# Patient Record
Sex: Female | Born: 1995 | Hispanic: Yes | Marital: Single | Smoking: Never smoker
Health system: Southern US, Community
[De-identification: ages and names within clinical notes are randomized; demographics above are authoritative.]

## PROBLEM LIST (undated history)

## (undated) DIAGNOSIS — F4325 Adjustment disorder with mixed disturbance of emotions and conduct: Secondary | ICD-10-CM

## (undated) DIAGNOSIS — R45851 Suicidal ideations: Secondary | ICD-10-CM

## (undated) HISTORY — PX: NO PAST SURGERIES: SHX2092

---
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
## 2018-06-03 IMAGING — CR DG CHEST 2V
2 series · 2 of 2 positions shown · non-contrast
Comparison: None.

CLINICAL DATA: Pt admitted after suicide attempt; pt had
tachycardia and cough.

EXAM:
CHEST  2 VIEW

[chest pa]
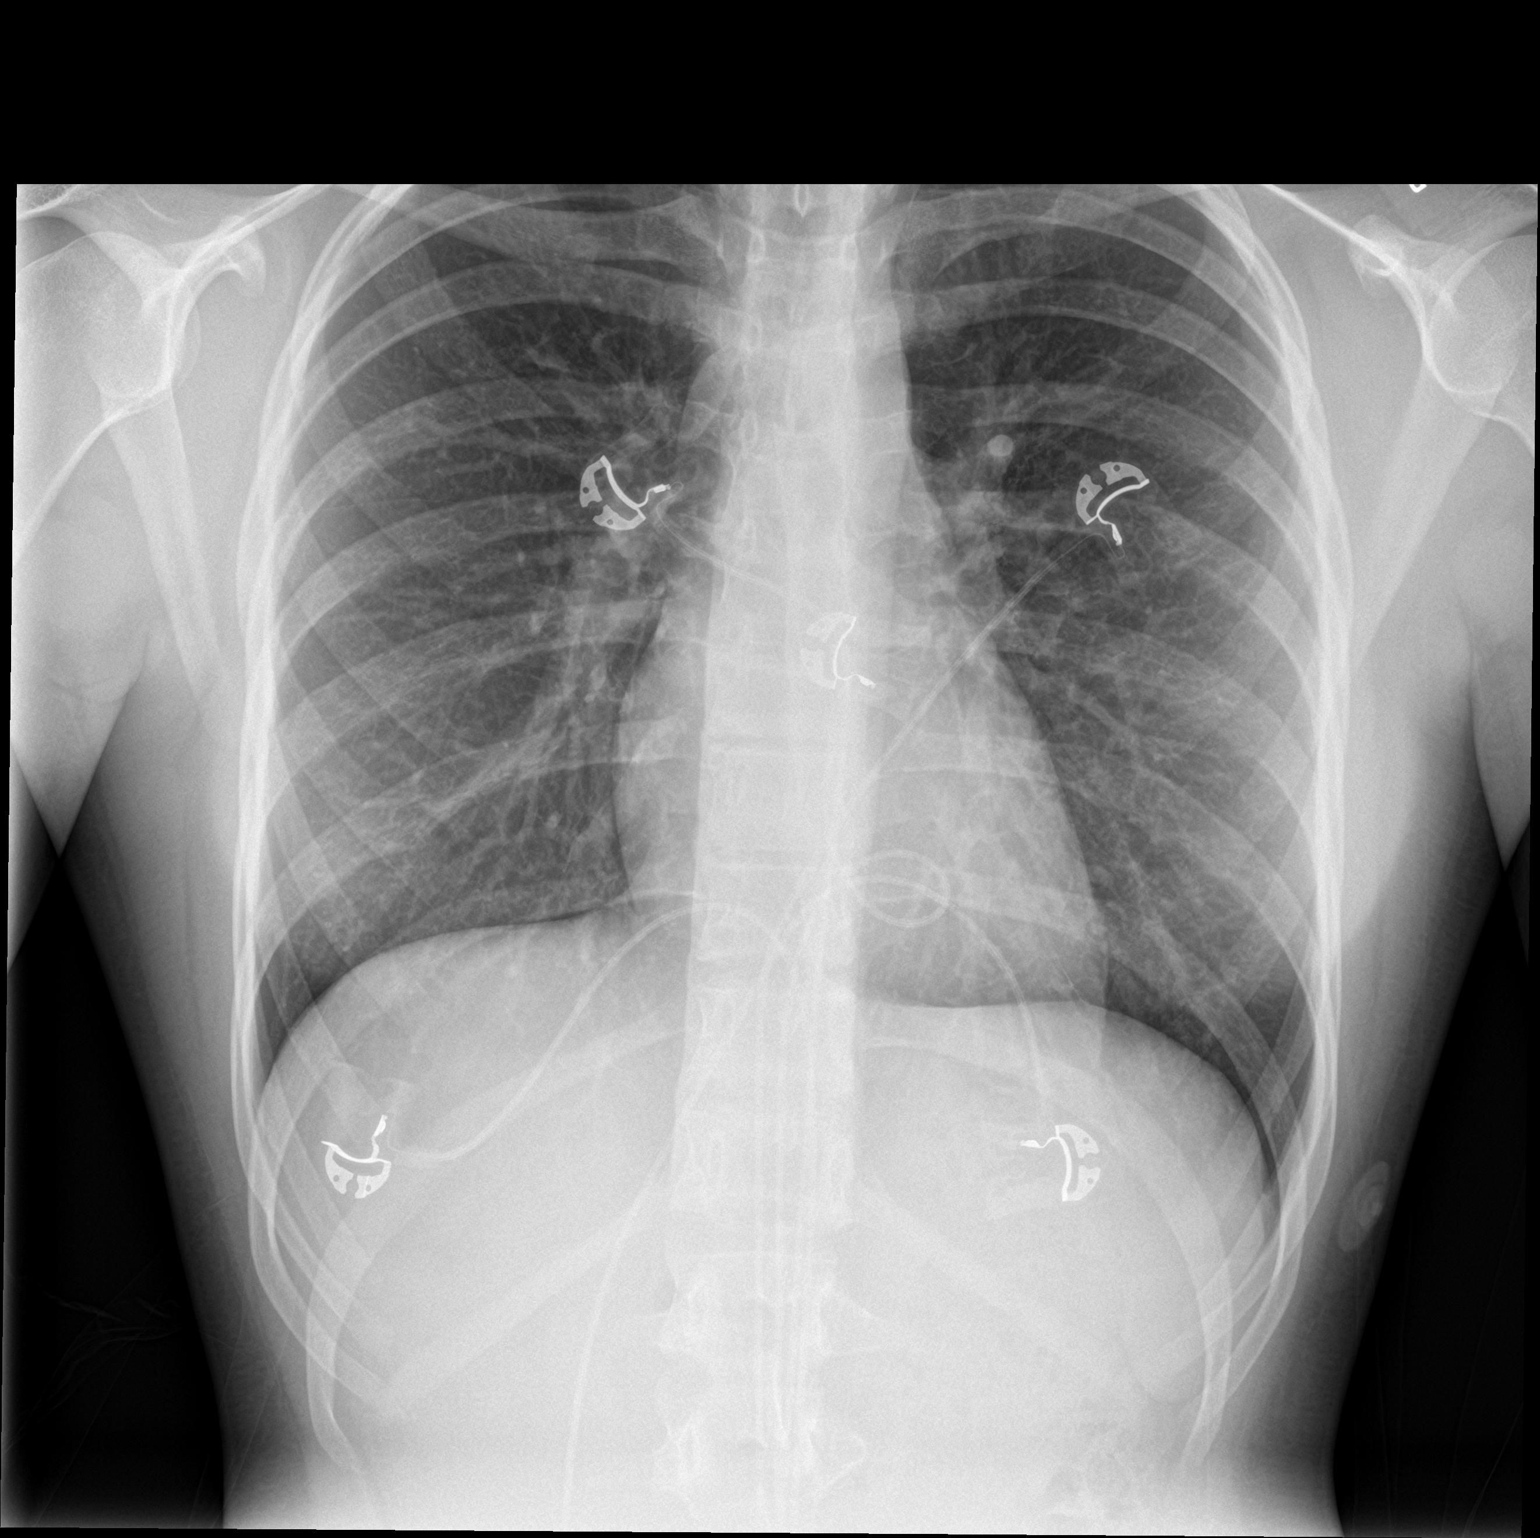

[chest lat]
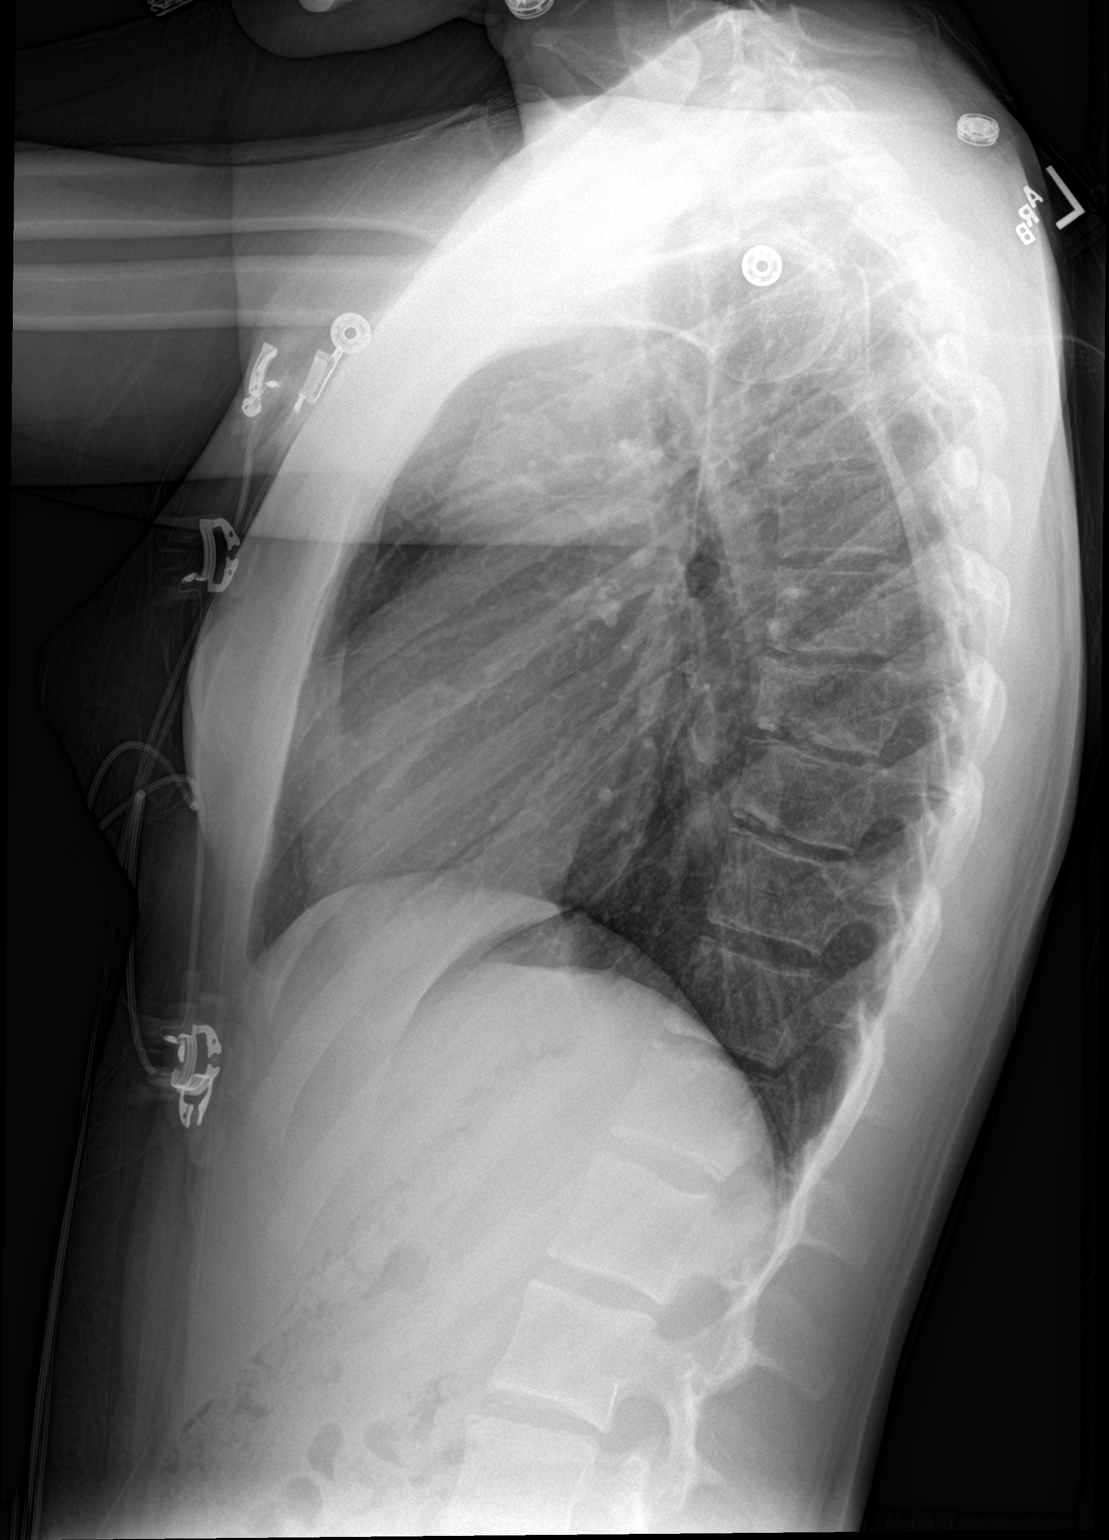

[2 of 2 positions shown; findings below may reference images not displayed]

FINDINGS: The heart size and mediastinal contours are within normal limits.
Both lungs are clear. No pleural effusion or pneumothorax. The
visualized skeletal structures are unremarkable.
IMPRESSION: Normal chest radiographs.
